# Patient Record
Sex: Female | Born: 1975 | Race: White | Hispanic: No | Marital: Single | Smoking: Current every day smoker
Health system: Southern US, Community
[De-identification: ages and names within clinical notes are randomized; demographics above are authoritative.]

## PROBLEM LIST (undated history)

## (undated) HISTORY — PX: APPENDECTOMY: SHX54

---

## 2017-10-07 ENCOUNTER — Emergency Department: Payer: Self-pay

## 2017-10-07 ENCOUNTER — Encounter: Payer: Self-pay | Admitting: Emergency Medicine

## 2017-10-07 ENCOUNTER — Other Ambulatory Visit: Payer: Self-pay

## 2017-10-07 ENCOUNTER — Emergency Department
Admission: EM | Admit: 2017-10-07 | Discharge: 2017-10-08 | Disposition: A | Discharge status: Home / Self Care | Payer: Self-pay | Attending: Emergency Medicine | Admitting: Emergency Medicine

## 2017-10-07 DIAGNOSIS — F17200 Nicotine dependence, unspecified, uncomplicated: Secondary | ICD-10-CM | POA: Insufficient documentation

## 2017-10-07 DIAGNOSIS — I639 Cerebral infarction, unspecified: Secondary | ICD-10-CM | POA: Insufficient documentation

## 2017-10-07 DIAGNOSIS — R42 Dizziness and giddiness: Secondary | ICD-10-CM

## 2017-10-07 LAB — BASIC METABOLIC PANEL
ANION GAP: 6 (ref 5–15)
BUN: 11 mg/dL (ref 6–20)
CALCIUM: 8.8 mg/dL — AB (ref 8.9–10.3)
CO2: 26 mmol/L (ref 22–32)
CREATININE: 0.49 mg/dL (ref 0.44–1.00)
Chloride: 107 mmol/L (ref 98–111)
GFR calc Af Amer: >60 mL/min (ref >60)
GLUCOSE: 115 mg/dL — AB (ref 70–99)
Potassium: 4.5 mmol/L (ref 3.5–5.1)
Sodium: 139 mmol/L (ref 135–145)

## 2017-10-07 LAB — CBC WITH DIFFERENTIAL/PLATELET
BASOS ABS: 0.1 10*3/uL (ref 0–0.1)
BASOS PCT: 1 %
EOS ABS: 0.3 10*3/uL (ref 0–0.7)
EOS PCT: 3 %
HEMATOCRIT: 40.3 % (ref 35.0–47.0)
Hemoglobin: 13.6 g/dL (ref 12.0–16.0)
Lymphocytes Relative: 28 %
Lymphs Abs: 3.4 10*3/uL (ref 1.0–3.6)
MCH: 29.2 pg (ref 26.0–34.0)
MCHC: 33.7 g/dL (ref 32.0–36.0)
MCV: 86.6 fL (ref 80.0–100.0)
MONO ABS: 0.7 10*3/uL (ref 0.2–0.9)
MONOS PCT: 6 %
NEUTROS ABS: 7.4 10*3/uL — AB (ref 1.4–6.5)
Neutrophils Relative %: 62 %
PLATELETS: 313 10*3/uL (ref 150–440)
RBC: 4.65 MIL/uL (ref 3.80–5.20)
RDW: 18.3 % — ABNORMAL HIGH (ref 11.5–14.5)
WBC: 11.9 10*3/uL — ABNORMAL HIGH (ref 3.6–11.0)

## 2017-10-07 LAB — URINALYSIS, COMPLETE (UACMP) WITH MICROSCOPIC
BILIRUBIN URINE: NEGATIVE
Bacteria, UA: NONE SEEN
GLUCOSE, UA: NEGATIVE mg/dL
KETONES UR: NEGATIVE mg/dL
Leukocytes, UA: NEGATIVE
Nitrite: NEGATIVE
PROTEIN: NEGATIVE mg/dL
Specific Gravity, Urine: 1.01 (ref 1.005–1.030)
pH: 7 (ref 5.0–8.0)

## 2017-10-07 LAB — POCT PREGNANCY, URINE: Preg Test, Ur: NEGATIVE

## 2017-10-07 MED ORDER — MECLIZINE HCL 25 MG PO TABS
50.0000 mg | ORAL_TABLET | Freq: Once | ORAL | Status: AC
  Administered 2017-10-07: 50 mg via ORAL
  Filled 2017-10-07 (×2): qty 2

## 2017-10-07 MED ORDER — ASPIRIN 81 MG PO CHEW
324.0000 mg | CHEWABLE_TABLET | Freq: Once | ORAL | Status: AC
  Administered 2017-10-07: 324 mg via ORAL
  Filled 2017-10-07: qty 4

## 2017-10-07 MED ORDER — ATORVASTATIN CALCIUM 20 MG PO TABS
ORAL_TABLET | ORAL | Status: AC
  Filled 2017-10-07: qty 1

## 2017-10-07 MED ORDER — ONDANSETRON HCL 4 MG PO TABS
ORAL_TABLET | ORAL | Status: AC
  Filled 2017-10-07: qty 1

## 2017-10-07 MED ORDER — ACETAMINOPHEN 325 MG PO TABS
ORAL_TABLET | ORAL | Status: AC
  Administered 2017-10-07: 650 mg via ORAL
  Filled 2017-10-07: qty 2

## 2017-10-07 MED ORDER — ATORVASTATIN CALCIUM 80 MG PO TABS: 80.0000 mg | ORAL_TABLET | Freq: Every day | ORAL | 0 refills | Status: AC

## 2017-10-07 MED ORDER — ONDANSETRON HCL 4 MG/2ML IJ SOLN
INTRAMUSCULAR | Status: AC
  Administered 2017-10-07: 4 mg via INTRAVENOUS
  Filled 2017-10-07: qty 2

## 2017-10-07 MED ORDER — ONDANSETRON HCL 4 MG/2ML IJ SOLN: 4.0000 mg | Freq: Once | INTRAMUSCULAR | Status: DC

## 2017-10-07 MED ORDER — METHYLPREDNISOLONE SODIUM SUCC 125 MG IJ SOLR
125.0000 mg | Freq: Once | INTRAMUSCULAR | Status: AC
  Administered 2017-10-07: 125 mg via INTRAVENOUS
  Filled 2017-10-07: qty 2

## 2017-10-07 MED ORDER — ACETAMINOPHEN 325 MG PO TABS
650.0000 mg | ORAL_TABLET | Freq: Once | ORAL | Status: AC
  Administered 2017-10-07: 650 mg via ORAL

## 2017-10-07 MED ORDER — NICOTINE 21 MG/24HR TD PT24
21.0000 mg | MEDICATED_PATCH | Freq: Once | TRANSDERMAL | Status: DC
  Administered 2017-10-07: 21 mg via TRANSDERMAL
  Filled 2017-10-07: qty 1

## 2017-10-07 MED ORDER — MECLIZINE HCL 25 MG PO TABS: 25.0000 mg | ORAL_TABLET | Freq: Three times a day (TID) | ORAL | 1 refills | Status: DC | PRN

## 2017-10-07 MED ORDER — ASPIRIN EC 81 MG PO TBEC: 81.0000 mg | DELAYED_RELEASE_TABLET | Freq: Every day | ORAL | 0 refills | Status: AC

## 2017-10-07 MED ORDER — MORPHINE SULFATE (PF) 4 MG/ML IV SOLN: 4.0000 mg | Freq: Once | INTRAVENOUS | Status: DC

## 2017-10-07 MED ORDER — MORPHINE SULFATE (PF) 4 MG/ML IV SOLN
INTRAVENOUS | Status: AC
  Filled 2017-10-07: qty 1

## 2017-10-07 MED ORDER — ASPIRIN EC 81 MG PO TBEC: 81.0000 mg | DELAYED_RELEASE_TABLET | Freq: Every day | ORAL | 0 refills | Status: DC

## 2017-10-07 MED ORDER — MECLIZINE HCL 25 MG PO TABS: 25.0000 mg | ORAL_TABLET | Freq: Three times a day (TID) | ORAL | 1 refills | Status: AC | PRN

## 2017-10-07 MED ORDER — ASPIRIN EC 325 MG PO TBEC: 325.0000 mg | DELAYED_RELEASE_TABLET | Freq: Every day | ORAL | 0 refills | Status: DC

## 2017-10-07 MED ORDER — ONDANSETRON HCL 4 MG/2ML IJ SOLN
4.0000 mg | Freq: Once | INTRAMUSCULAR | Status: AC
  Administered 2017-10-07: 4 mg via INTRAVENOUS

## 2017-10-07 MED ORDER — LORAZEPAM 2 MG/ML IJ SOLN
0.5000 mg | Freq: Once | INTRAMUSCULAR | Status: AC
  Administered 2017-10-07: 0.5 mg via INTRAVENOUS
  Filled 2017-10-07: qty 1

## 2017-10-07 MED ORDER — ONDANSETRON HCL 4 MG/2ML IJ SOLN
INTRAMUSCULAR | Status: AC
  Filled 2017-10-07: qty 2

## 2017-10-07 MED ORDER — SODIUM CHLORIDE 0.9 % IV BOLUS
1000.0000 mL | Freq: Once | INTRAVENOUS | Status: AC
  Administered 2017-10-07: 1000 mL via INTRAVENOUS

## 2017-10-07 MED ORDER — ATORVASTATIN CALCIUM 80 MG PO TABS: 80.0000 mg | ORAL_TABLET | Freq: Every day | ORAL | 0 refills | Status: DC

## 2017-10-07 MED ORDER — ATORVASTATIN CALCIUM 20 MG PO TABS
80.0000 mg | ORAL_TABLET | Freq: Every day | ORAL | Status: DC
  Administered 2017-10-07: 80 mg via ORAL

## 2017-10-07 NOTE — ED Triage Notes (Signed)
Pt via ems from home with dizziness, headache, earache since awakening at 1040. Pt went to sleep at 0400 this morning and was fine. She denies any health problems or recent illness. Pt tearful during triage.

## 2017-10-07 NOTE — ED Notes (Signed)
Patient transported to MRI 

## 2017-10-07 NOTE — ED Notes (Signed)
Pt. Returned to tx. room in stable condition with no acute changes since departure from unit for scans.   

## 2017-10-07 NOTE — ED Notes (Signed)
Patient transported to MRI by this EDT.  

## 2017-10-07 NOTE — ED Notes (Signed)
Pt given ginger ale, peanut butter and crackers.

## 2017-10-07 NOTE — ED Notes (Signed)

## 2017-10-07 NOTE — Discharge Instructions (Addendum)
Take the aspirin and Lipitor as prescribed until you follow-up with a primary care doctor and a neurologist.  You can take the meclizine as needed for dizziness or vertigo.  Return to the emergency department immediately for new, worsening, persistent severe dizziness or vertigo, vision changes, speech difficulty, weakness or numbness, difficulty walking, or any other new or worsening symptoms that concern you.  You may also return at any time if you wish to resume your stroke work-up as an inpatient.

## 2017-10-07 NOTE — ED Notes (Signed)
Pt reports that she still has some dizziness ("which might be hunger") and that she feels much better. Pt alert & oriented. NAD noted.

## 2017-10-07 NOTE — ED Notes (Addendum)
Call from MRI that pt c/o sudden onset excruciating posterior HA. Verbal order for morphine and zofran, given to pt in MRI, but pt refused. States would take tylenol when returned to ER tx room

## 2017-10-07 NOTE — ED Provider Notes (Signed)
[facility] Emergency Department Provider Note  ____________________________________________  Time seen: Approximately 6:35 PM  I have reviewed the triage vital signs and the nursing notes.   HISTORY  Chief Complaint Dizziness    HPI Catherine Ford is a 42 y.o. female with no known significant past medical history who complains of sudden onset of dizziness described as room spinning since awakening at 10:40 AM today when she woke up and got out of bed.  At 4 AM when she went to sleep early this morning she was fine.   Denies any recent illness or head trauma.  Never had anything like this before.  Denies history of vertigo.  No vision changes or hearing changes.  No peripheral paresthesias or weakness.  Feels off balance with walking due to the spinning motion.  Symptoms have been constant and severe, no aggravating or alleviating factors.  Denies headache or neck pain    History reviewed. No pertinent past medical history.   There are no active problems to display for this patient.    Past Surgical History:  Procedure Laterality Date  . APPENDECTOMY       Prior to Admission medications   Medication Sig Start Date End Date Taking? Authorizing Provider  aspirin EC 325 MG tablet Take 1 tablet (325 mg total) by mouth daily. 10/07/17   Sharman CheekStafford, Shamyah Stantz, MD  meclizine (ANTIVERT) 25 MG tablet Take 1 tablet (25 mg total) by mouth 3 (three) times daily as needed for dizziness or nausea. 10/07/17   Sharman CheekStafford, Michalene Debruler, MD     Allergies Iodine and Other   History reviewed. No pertinent family history.  Social History Social History   Tobacco Use  . Smoking status: Current Every Day Smoker    Packs/day: 1.00  . Smokeless tobacco: Never Used  Substance Use Topics  . Alcohol use: Not Currently  . Drug use: Never    Review of Systems  Constitutional:   No fever or chills.  ENT:   No sore throat. No rhinorrhea. Cardiovascular:   No chest pain or  syncope. Respiratory:   No dyspnea or cough. Gastrointestinal:   Negative for abdominal pain, vomiting and diarrhea.  Musculoskeletal:   Negative for focal pain or swelling All other systems reviewed and are negative except as documented above in ROS and HPI.  ____________________________________________   PHYSICAL EXAM:  VITAL SIGNS: ED Triage Vitals  Enc Vitals Group     BP 10/07/17 1126 (!) 156/102     Pulse Rate 10/07/17 1126 61     Resp 10/07/17 1126 17     Temp 10/07/17 1126 97.9 F (36.6 C)     Temp Source 10/07/17 1126 Oral     SpO2 10/07/17 1126 98 %     Weight 10/07/17 1115 245 lb (111.1 kg)     Height 10/07/17 1115 5\' 8"  (1.727 m)     Head Circumference --      Peak Flow --      Pain Score 10/07/17 1115 4     Pain Loc --      Pain Edu? --      Excl. in GC? --     Vital signs reviewed, nursing assessments reviewed.   Constitutional:   Alert and oriented. Non-toxic appearance. Eyes:   Conjunctivae are normal. EOMI. PERRL.  Dizziness worsened by eye movements.  No nystagmus. ENT      Head:   Normocephalic and atraumatic.      Nose:   No congestion/rhinnorhea.  Mouth/Throat:   MMM, no pharyngeal erythema. No peritonsillar mass.       Neck:   No meningismus. Full ROM. Hematological/Lymphatic/Immunilogical:   No cervical lymphadenopathy. Cardiovascular:   RRR. Symmetric bilateral radial and DP pulses.  No murmurs. Cap refill less than 2 seconds. Respiratory:   Normal respiratory effort without tachypnea/retractions. Breath sounds are clear and equal bilaterally. No wheezes/rales/rhonchi. Gastrointestinal:   Soft and nontender. Non distended. There is no CVA tenderness.  No rebound, rigidity, or guarding. Musculoskeletal:   Normal range of motion in all extremities. No joint effusions.  No lower extremity tenderness.  No edema. Neurologic:   Normal speech and language.  Motor grossly intact. Normal finger-to-nose Cranial nerves III through XII intact No  acute focal neurologic deficits are appreciated.  Skin:    Skin is warm, dry and intact. No rash noted.  No petechiae, purpura, or bullae.  ____________________________________________    LABS (pertinent positives/negatives) (all labs ordered are listed, but only abnormal results are displayed) Labs Reviewed  URINALYSIS, COMPLETE (UACMP) WITH MICROSCOPIC - Abnormal; Notable for the following components:      Result Value   Color, Urine YELLOW (*)    APPearance CLEAR (*)    Hgb urine dipstick MODERATE (*)    All other components within normal limits  BASIC METABOLIC PANEL - Abnormal; Notable for the following components:   Glucose, Bld 115 (*)    Calcium 8.8 (*)    All other components within normal limits  CBC WITH DIFFERENTIAL/PLATELET - Abnormal; Notable for the following components:   WBC 11.9 (*)    RDW 18.3 (*)    Neutro Abs 7.4 (*)    All other components within normal limits  POC URINE PREG, ED  POCT PREGNANCY, URINE   ____________________________________________   EKG  Interpreted by me Normal sinus rhythm rate of 63, normal axis intervals QRS ST segments and T waves.    ____________________________________________    RADIOLOGY  Ct Head Wo Contrast  Result Date: 10/07/2017 CLINICAL DATA:  Headache and dizziness. EXAM: CT HEAD WITHOUT CONTRAST TECHNIQUE: Contiguous axial images were obtained from the base of the skull through the vertex without intravenous contrast. COMPARISON:  None. FINDINGS: Brain: Age-indeterminate lacunar infarct in the right cerebellum. No hemorrhage, hydrocephalus, extra-axial collection or mass lesion/mass effect. Vascular: No hyperdense vessel or unexpected calcification. Skull: Normal. Negative for fracture or focal lesion. Sinuses/Orbits: No acute finding. Other: None. IMPRESSION: 1. 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 Electronically Signed   By: Obie Dredge M.D.   On: 10/07/2017 15:02     ____________________________________________   PROCEDURES Procedures  ____________________________________________  DIFFERENTIAL DIAGNOSIS   Peripheral vertigo, less likely ischemic stroke or intracranial hemorrhage.  Doubt intracranial hypertension glaucoma.  CLINICAL IMPRESSION / ASSESSMENT AND PLAN / ED COURSE  Pertinent labs & imaging results that were available during my care of the patient were reviewed by me and considered in my medical decision making (see chart for details).      Clinical Course as of Oct 07 1833  Mon Oct 07, 2017  1318 Patient presents with sudden onset of dizziness, consistent with peripheral vertigo.  Evidence of otitis media of the right ear on exam.  Doubt stroke or intracranial hemorrhage meningitis encephalitis.  Patient given steroids, Ativan, meclizine and Zofran as well as IV fluids for hydration.  On reassessment at 1:15 PM, patient is not feeling significantly better and still has persistent vertigo.  At this point given his symptoms have now been continuous for  2-1/2 hours, I will obtain imaging to evaluate for intracranial aneurysm   [PS]  1709 Offered hospitalization for stroke workup after talking with Dr. Thad Ranger.  Pt declines, wants to go home tonight if at all possible. I will obtain mri/mra from ED. Give aspirin. If mri reassuring will plan for outpatient follow up.    [PS]    Clinical Course User Index [PS] Sharman Cheek, MD     ----------------------------------------- 6:36 PM on 10/07/2017 -----------------------------------------  Awaiting MRI/MRA.  Patient still feeling better.  Care will be signed out to Dr. Marisa Severin to follow-up on MRI.  If results are all reassuring, patient can be discharged outpatient follow-up.  If is positive for acute stroke, would recommend patient hospitalization for further stroke work-up especially given lack of establish follow-up.  ____________________________________________   FINAL  CLINICAL IMPRESSION(S) / ED DIAGNOSES    Final diagnoses:  Dizziness  Vertigo  Cerebellar infarct [facility])     ED Discharge Orders        Ordered    aspirin EC 325 MG tablet  Daily     10/07/17 1834    meclizine (ANTIVERT) 25 MG tablet  3 times daily PRN     10/07/17 1834      Portions of this note were generated with dragon dictation software. Dictation errors may occur despite best attempts at proofreading.    Sharman Cheek, MD 10/07/17 [phone]

## 2017-10-07 NOTE — ED Provider Notes (Signed)
-----------------------------------------   11:25 PM on 10/07/2017 -----------------------------------------  I received signout on this patient from Dr. Scotty CourtStafford.  The patient had posterior headache and vertigo type symptoms and CT showing possible age-indeterminate lacunar infarct.  The plan of care at the time of signout was to obtain an MRI to further evaluate the infarct and determine whether it was acute.  The patient had expressed a desire to be discharged home even if the imaging showed acute stroke.  On reassessment, the patient's symptoms have not worsened, and she feels relatively comfortable.  MRI confirms that the findings seen on CT represent an acute infarct.  I had an extensive discussion with the patient about the results of the work-up, the fact that the imaging showed a likely acute stroke, and the increased risk for further stroke if the patient did not have appropriate work-up and risk stratification.  I strongly recommended that the patient be admitted.  The patient states that she does not want to be admitted at this time because she does not want to be in the hospital.  At this time, the patient is able to paraphrase these risks back to me.  She demonstrates appropriate decision-making capacity.  I gave her extensive return precautions.  We have provided her with neurology and primary care referrals.  I also initiated the patient on aspirin and Lipitor.  I answered all of her questions and I reminded her that she may return at any time if she changes her mind and wishes to resume her work-up in the hospital.   Dionne BucySiadecki, Andreal Vultaggio, MD 10/07/17 [phone]

## 2019-05-18 IMAGING — MR MR MRA NECK W/O CM
9 of 12 series · 26 of 48 positions shown · non-contrast
Comparison: Prior CT from earlier the same day.

CLINICAL DATA: Initial evaluation for acute onset dizziness.

EXAM:
MRI HEAD WITHOUT CONTRAST
MRA HEAD WITHOUT CONTRAST
MRA NECK WITHOUT CONTRAST
TECHNIQUE: Multiplanar, multiecho pulse sequences of the brain and surrounding
structures were obtained without intravenous contrast. Angiographic
images of the Circle of Willis were obtained using MRA technique
without intravenous contrast. Angiographic images of the neck were
obtained using MRA technique without and with intravenous contrast.
Carotid stenosis measurements (when applicable) are obtained
utilizing NASCET criteria, using the distal internal carotid
diameter as the denominator.

[Series 2: T1 · sagittal · 5.0mm · 0.45mm/px · 1 of 23 slices shown]
[im 1/23]
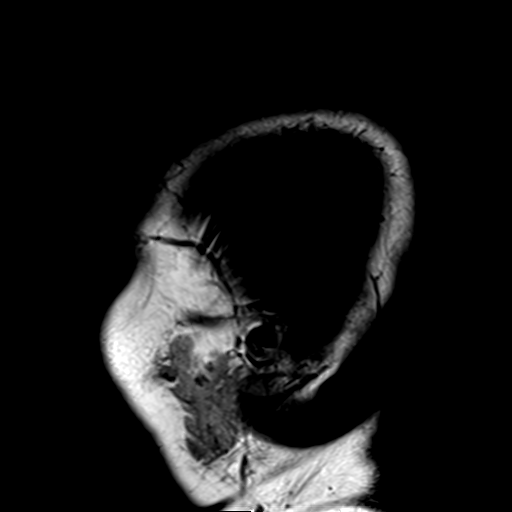

[Series 4: DWI · axial · 3.0mm · 1.80mm/px · z∈[-83,+79]mm · 3 of 52 slices shown (1 of 2)]
[im 1/52]
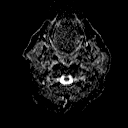
[im 26/52]
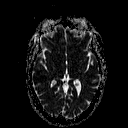
[im 52/52]
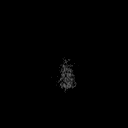

[Series 7: DWI · coronal · 3.0mm · 1.80mm/px · 3 of 47 slices shown (2 of 2)]
[im 1/47]
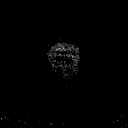
[im 24/47]
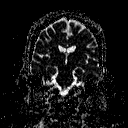
[im 47/47]
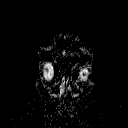

[Series 12: T2 · axial · 5.0mm · 0.45mm/px · z∈[-83,+86]mm · 2 of 27 slices shown (1 of 3)]
[im 1/27]
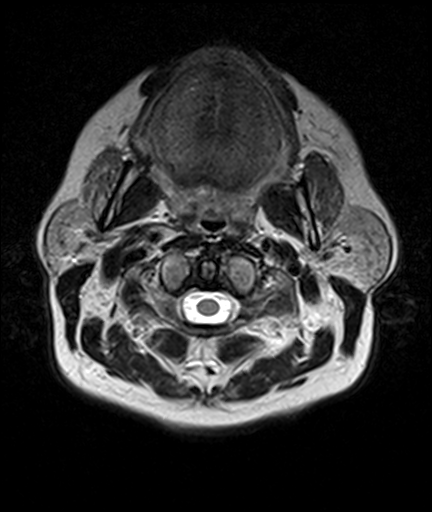
[im 27/27]
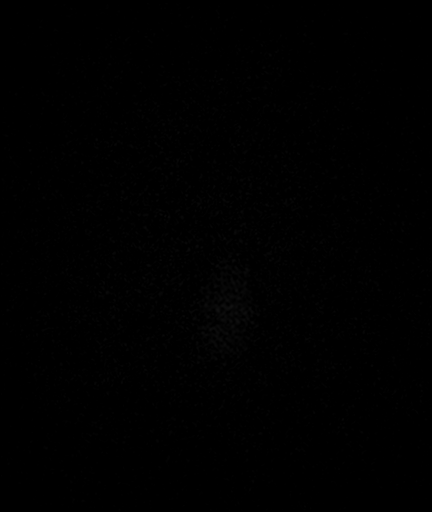

[Series 13: TOF · axial · non-contrast · 0.7mm · 0.37mm/px · z∈[-65,+12]mm · 7 of 130 slices shown]
[im 1/130]
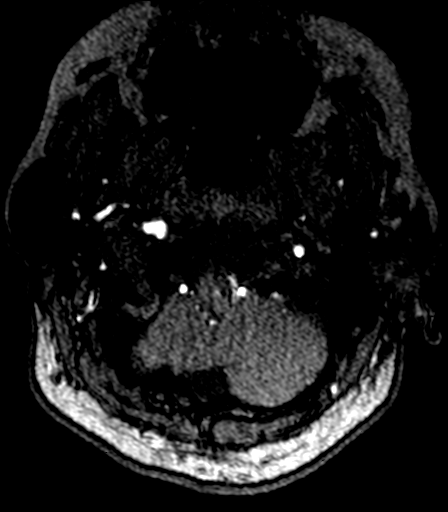
[im 19/130]
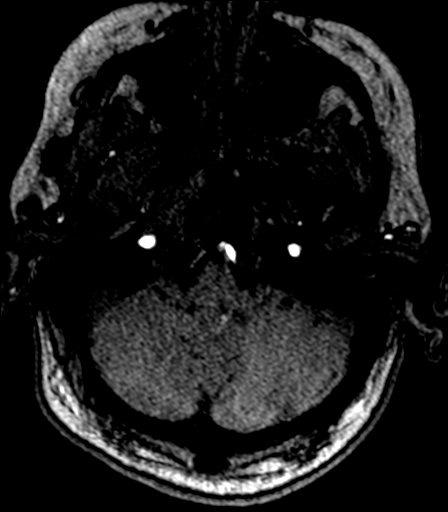
[im 37/130]
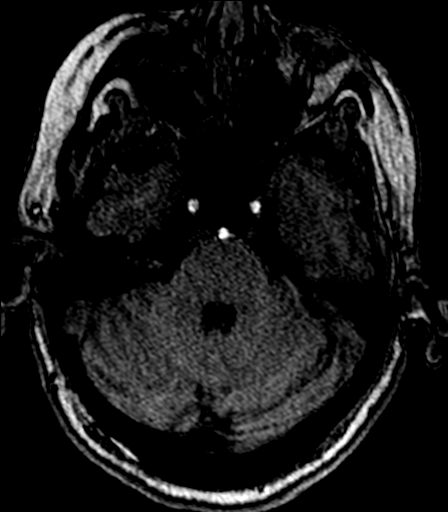
[im 56/130]
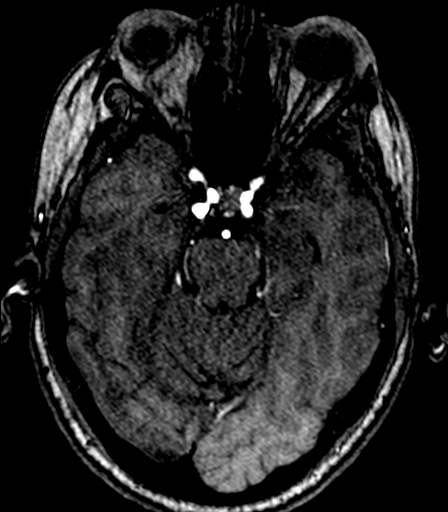
[im 74/130]
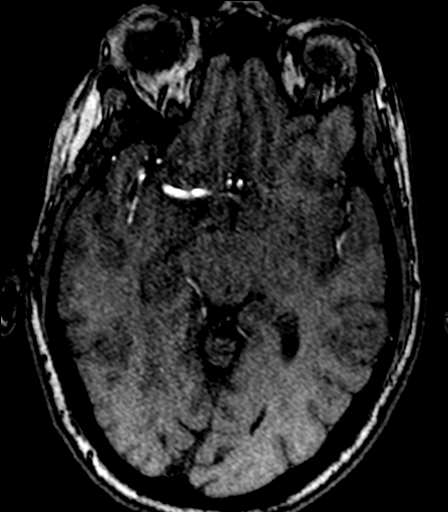
[im 93/130]
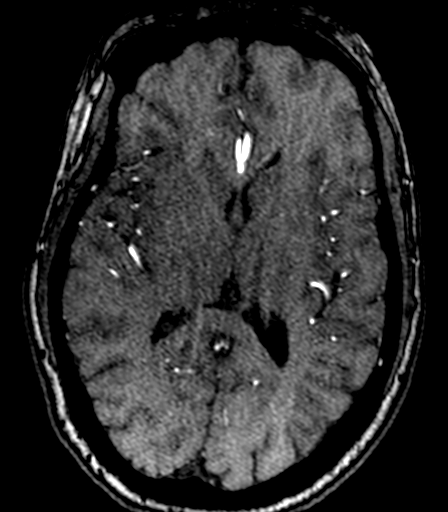
[im 111/130]
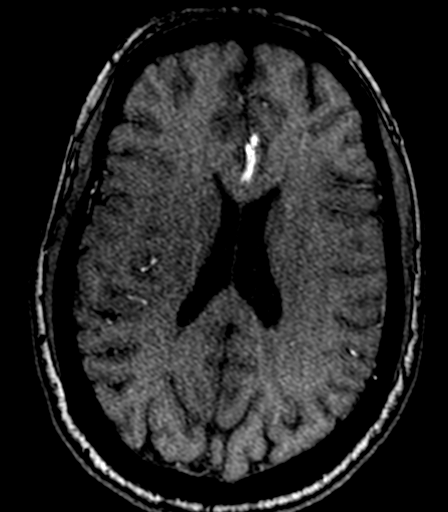

[Series 18: FLAIR · axial · 3.0mm · 0.45mm/px · z∈[-79,+83]mm · 4 of 55 slices shown]
[im 1/55]
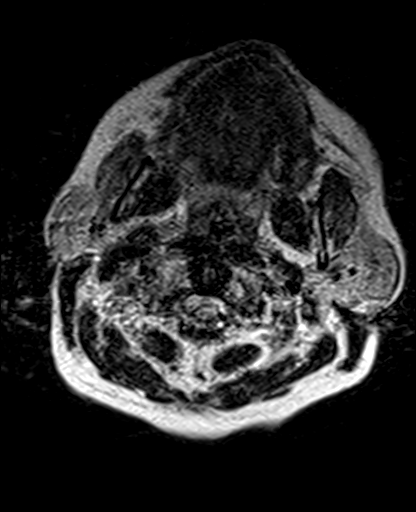
[im 19/55]
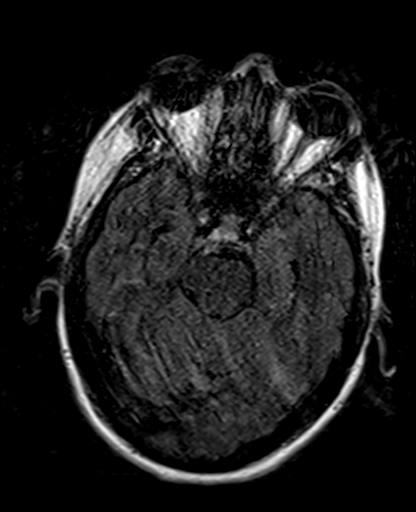
[im 37/55]
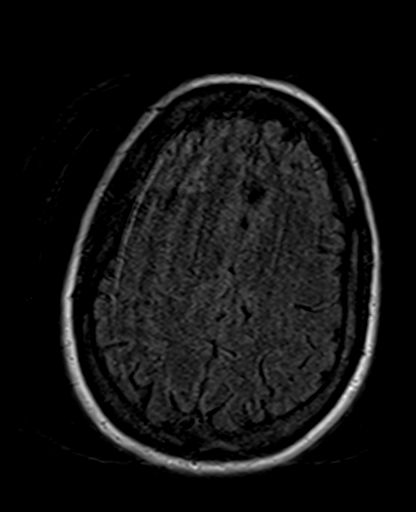
[im 55/55]
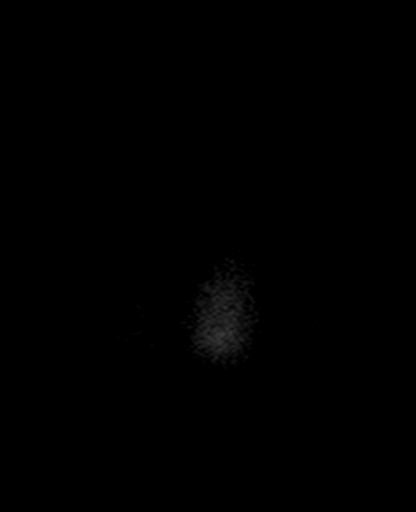

[Series 19: GRE · axial · 5.0mm · 0.45mm/px · z∈[-76,+80]mm · 2 of 25 slices shown]
[im 1/25]
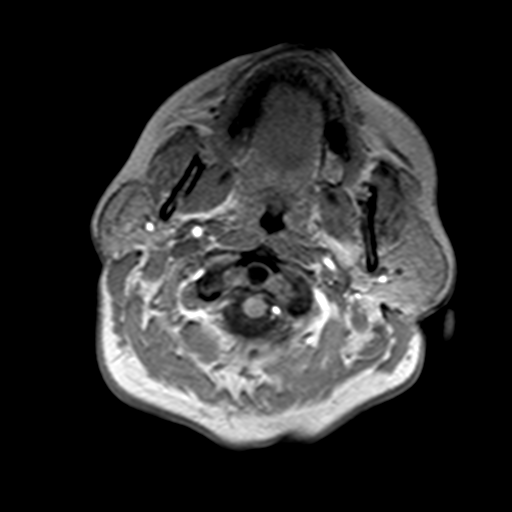
[im 25/25]
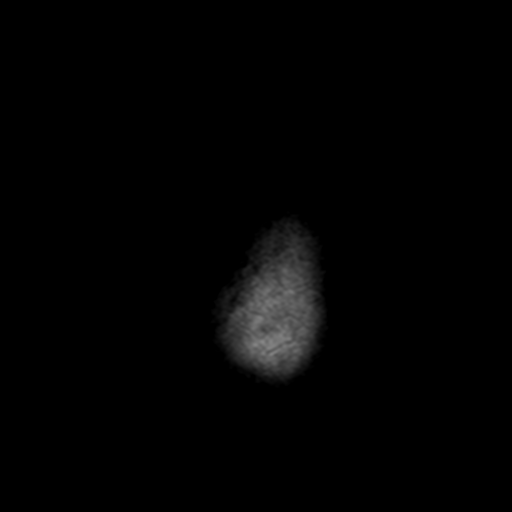

[Series 20: T2 · axial · 5.0mm · 1.20mm/px · z∈[-83,+86]mm · 2 of 27 slices shown (2 of 3)]
[im 1/27]
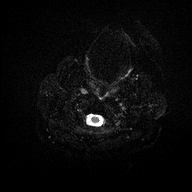
[im 27/27]
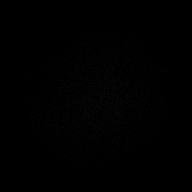

[Series 21: T2 · coronal · 5.0mm · 0.43mm/px · 2 of 29 slices shown (3 of 3)]
[im 1/29]
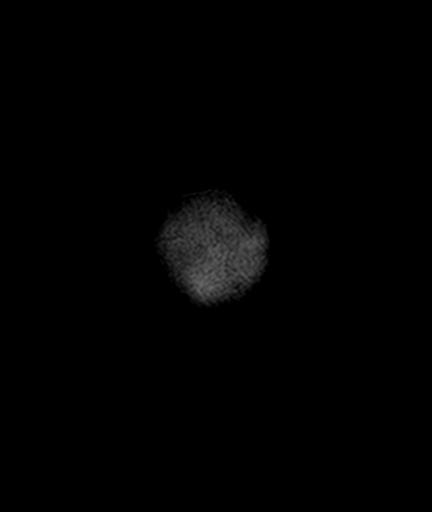
[im 29/29]
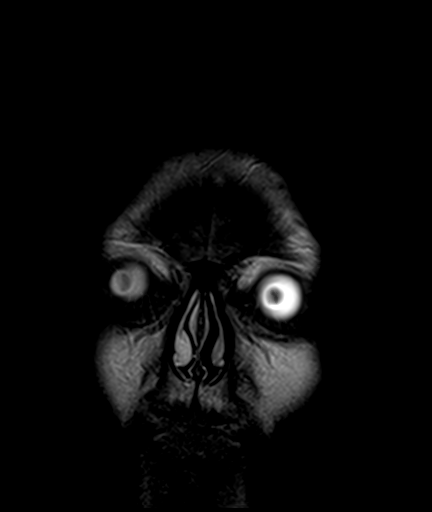

[26 of 48 positions shown; findings below may reference images not displayed]

FINDINGS: MRI HEAD FINDINGS

Brain: Examination technically limited by motion artifact.

Cerebral volume within normal limits for patient age. Two small foci
of restricted diffusion seen within the right cerebellar hemisphere,
consistent with acute ischemic infarcts. Largest area of infarction
present inferiorly and measures approximately 2 cm in size. Second
infarct involves the superior right cerebellar hemisphere. No
associated hemorrhage or mass effect. These correspond with signal
abnormality seen on prior CT. No other evidence for acute or
subacute ischemia. Gray-white matter differentiation otherwise
maintained. No other areas of chronic infarction. No foci of
susceptibility artifact to suggest acute or chronic intracranial
hemorrhage.

No mass lesion, midline shift or mass effect. No hydrocephalus. No
extra-axial fluid collection. Major dural sinuses are grossly
patent.

Pituitary gland and suprasellar region are normal. Midline
structures intact and normal.

Vascular: Major intracranial vascular flow voids well maintained and
normal in appearance.

Skull and upper cervical spine: Craniocervical junction normal.
Visualized upper cervical spine within normal limits. Bone marrow
signal intensity normal. No scalp soft tissue abnormality.

Sinuses/Orbits: Globes and orbital soft tissues within normal
limits. Paranasal sinuses are clear. No mastoid effusion. Inner ear
structures normal.

Other: None.

MRA HEAD FINDINGS

ANTERIOR CIRCULATION:

Distal cervical segments of the internal carotid arteries are widely
patent with antegrade flow. Petrous, cavernous, and supraclinoid
segments widely patent without stenosis. A1 segments widely patent
bilaterally. Left A1 hypoplastic, accounting for the slightly
diminutive left ICA is compared to the right. Normal anterior
communicating artery. Anterior cerebral arteries widely patent to
their distal aspects without stenosis. M1 segments widely patent
without stenosis. No proximal M2 occlusion. Distal MCA branches well
perfused and symmetric.

POSTERIOR CIRCULATION:

In vertebral arteries patent to the vertebrobasilar junction without
stenosis. Left vertebral artery dominant. Partially visualized
posterior inferior cerebral arteries patent bilaterally. Basilar
patent to its distal aspect without stenosis. Superior cerebral
arteries patent bilaterally. Left PCA supplied via the basilar.
Right PCA supplied via the basilar as well as the prominent right
posterior communicating artery. Tiny left posterior communicating
artery noted as well. PCAs widely patent to their distal aspects
without stenosis.

No intracranial aneurysm.

MRA NECK FINDINGS

Examination technically limited by motion artifact and lack of IV
contrast.

Time-of-flight imaging demonstrates patent antegrade flow within the
carotid and vertebral arteries bilaterally.

Visualized aortic arch of normal caliber with normal 3 vessel
morphology. No flow-limiting stenosis about the origin of the great
vessels. Visualized subclavian arteries patent without stenosis.

Right common and internal carotid arteries patent without stenosis
or occlusion. No significant atheromatous narrowing about the right
carotid bifurcation.

Left common and internal carotid arteries patent without stenosis or
occlusion. No significant atheromatous narrowing about the left
carotid bifurcation.

Both of the vertebral arteries arise from the subclavian arteries.
Left vertebral artery dominant. Pre foraminal right V1 segment not
well assessed on this examination. Vertebral arteries patent within
the neck without occlusion or obvious stenosis.
IMPRESSION: MRI HEAD IMPRESSION:

1. Two small acute ischemic nonhemorrhagic right cerebellar
infarcts. No associated mass effect. These correspond with signal
abnormality seen on prior CT.
2. Otherwise normal brain MRI.

MRA HEAD IMPRESSION:

Normal intracranial MRA. No large vessel occlusion. No
hemodynamically significant or correctable stenosis.

MRA NECK IMPRESSION:

Negative MRA of the neck with no vascular occlusion or
hemodynamically significant or critical stenosis. Please note
examination is somewhat technically limited by motion artifact and
lack of IV contrast.

## 2019-05-18 IMAGING — CT CT HEAD W/O CM
3 series · 16 of 47 positions shown, 19 images · non-contrast
Comparison: None.

CLINICAL DATA: Headache and dizziness.

EXAM:
CT HEAD WITHOUT CONTRAST
TECHNIQUE: Contiguous axial images were obtained from the base of the skull
through the vertex without intravenous contrast.

[Series 2: head wo · axial · 0.41mm/px · z∈[+236,+361]mm · 10 of 30 slices shown, 13 images]
[im 3/30  brain]
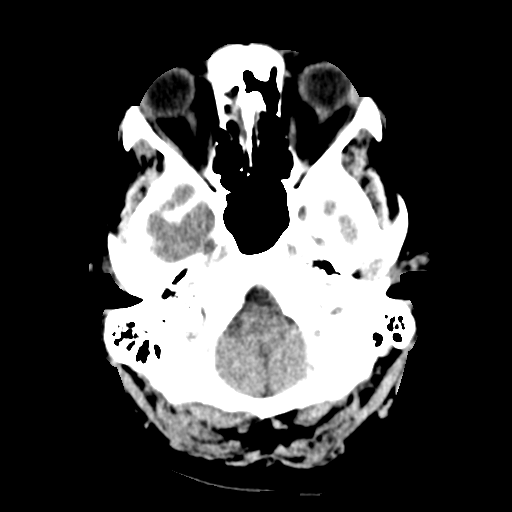
[im 3/30  bone]
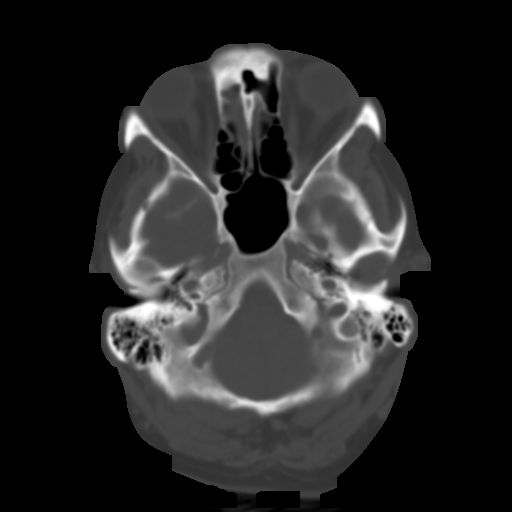
[im 6/30  brain]
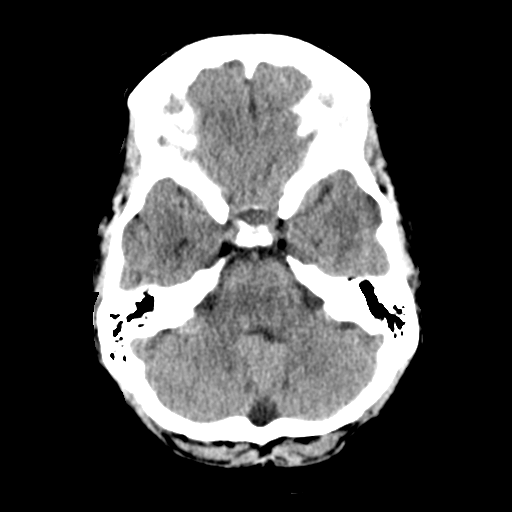
[im 9/30  brain]
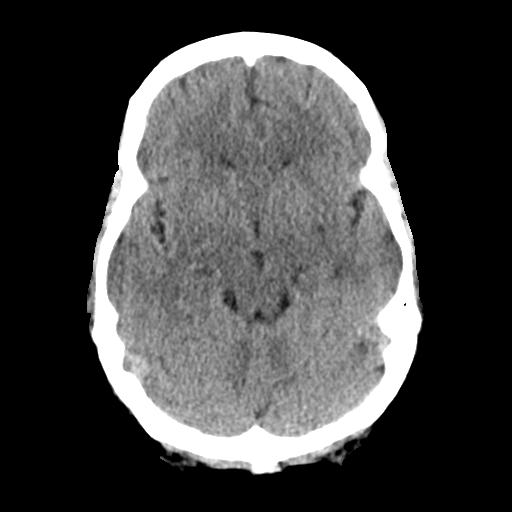
[im 11/30  brain]
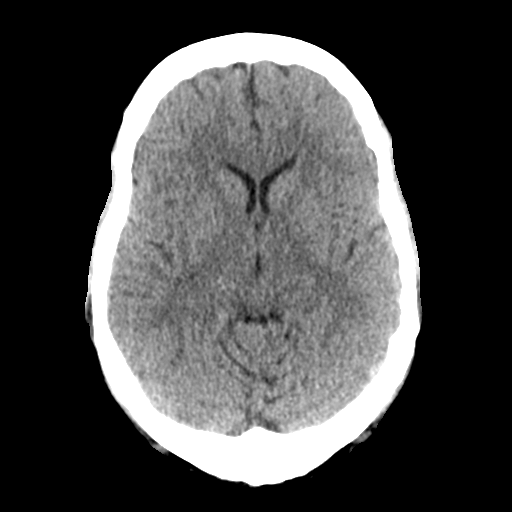
[im 14/30  brain]
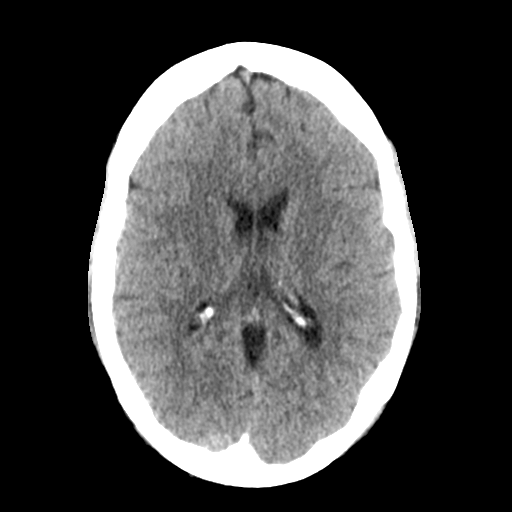
[im 14/30  bone]
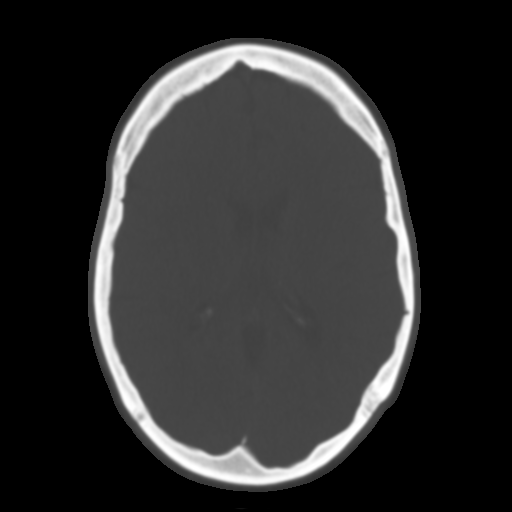
[im 17/30  brain]
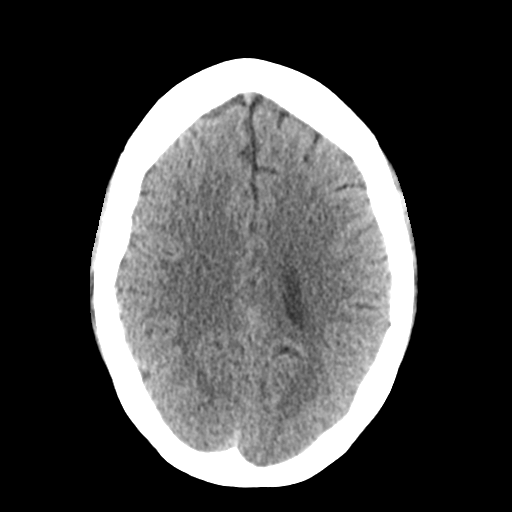
[im 20/30  brain]
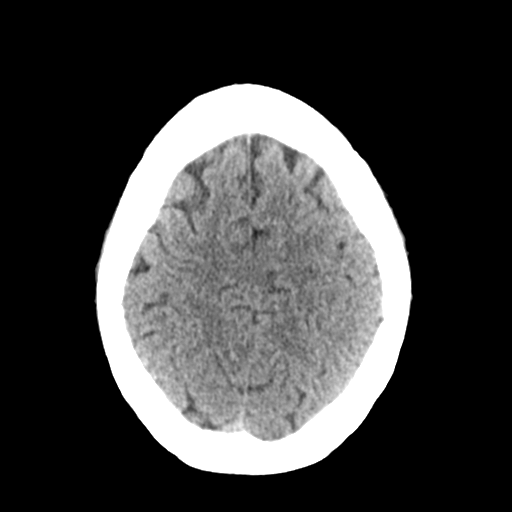
[im 23/30  brain]
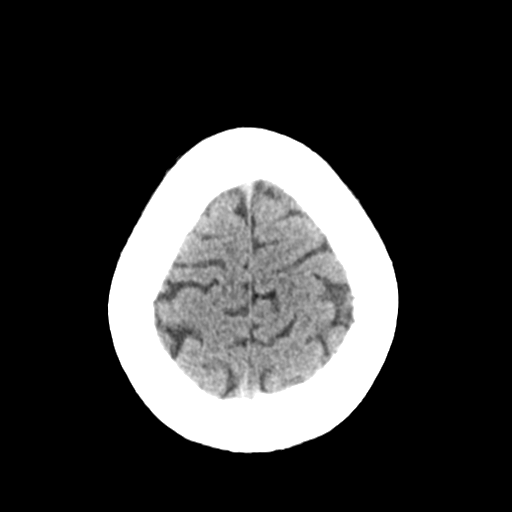
[im 25/30  brain]
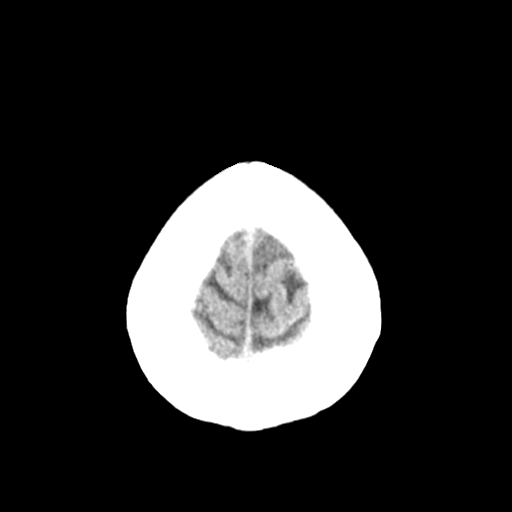
[im 25/30  bone]
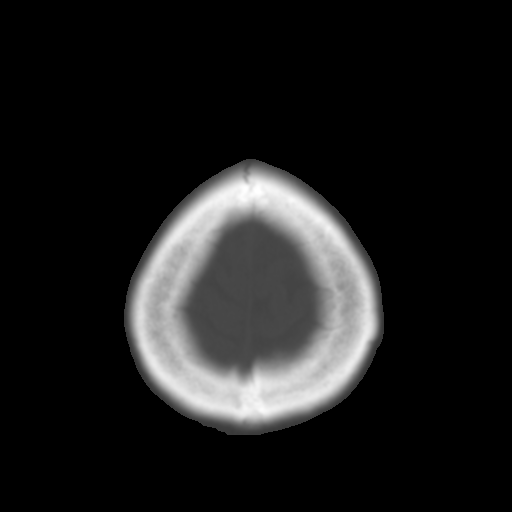
[im 28/30  brain]
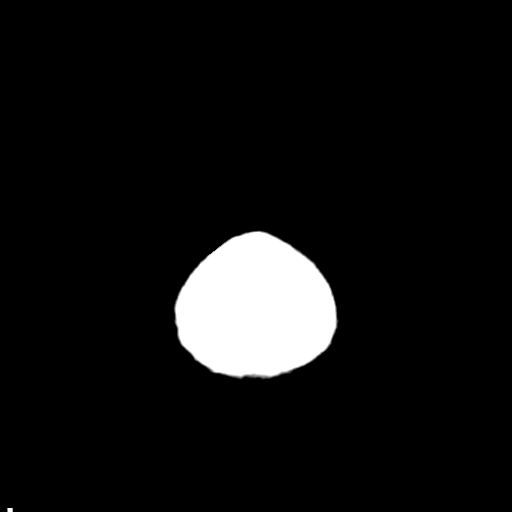

[Series 4: coronal soft tissue · coronal · 0.31mm/px · 3 of 63 slices shown]
[im 21/63  brain]
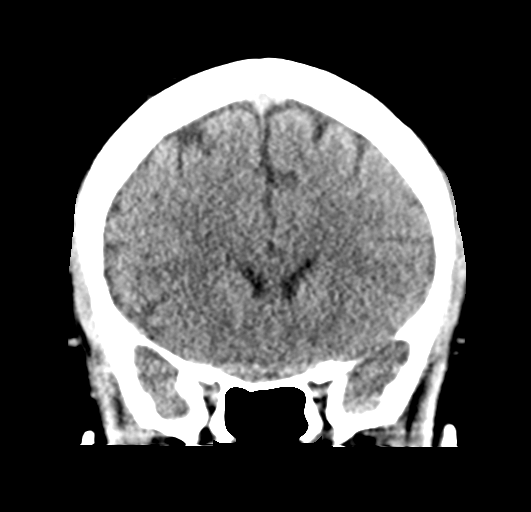
[im 28/63  brain]
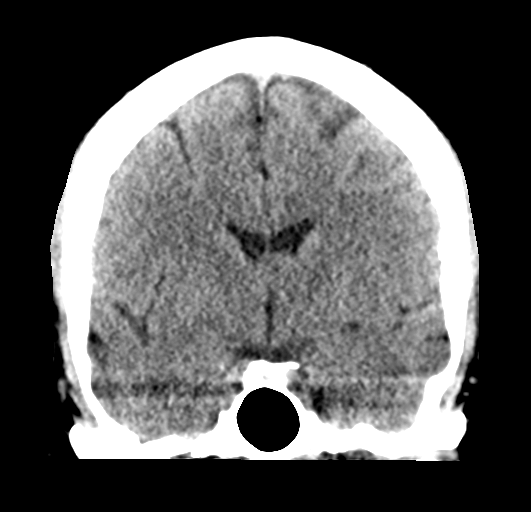
[im 35/63  brain]
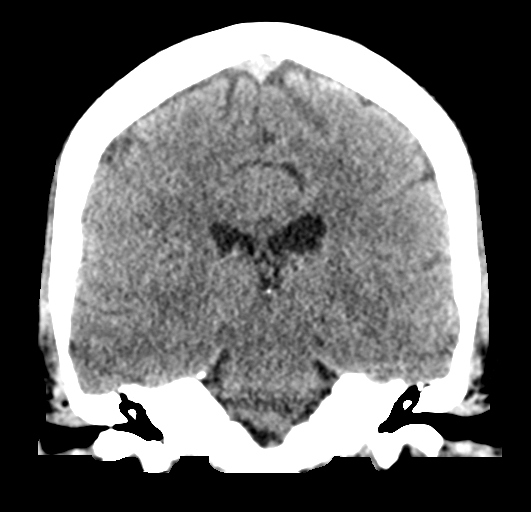

[Series 5: sagittal soft tissue · sagittal · 0.31mm/px · 3 of 48 slices shown]
[im 16/48  brain]
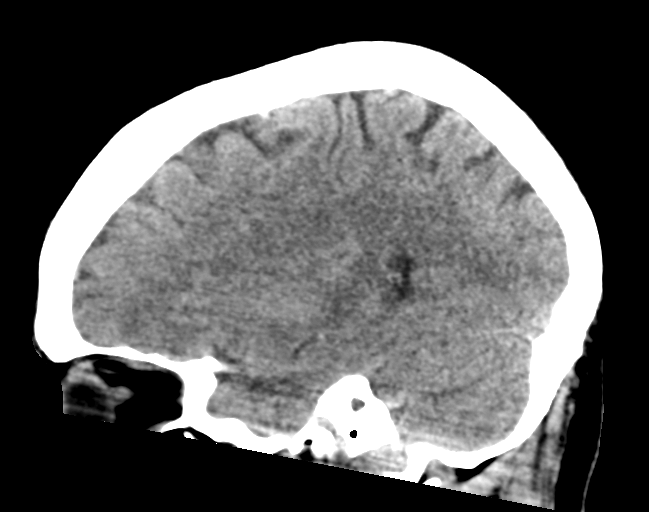
[im 24/48  brain]
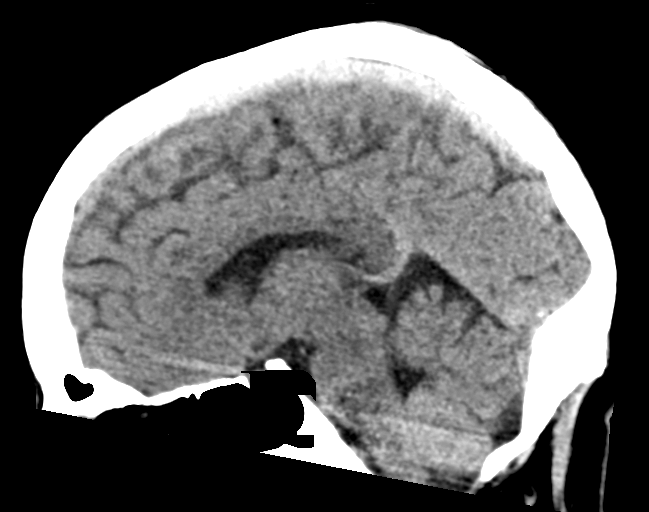
[im 32/48  brain]
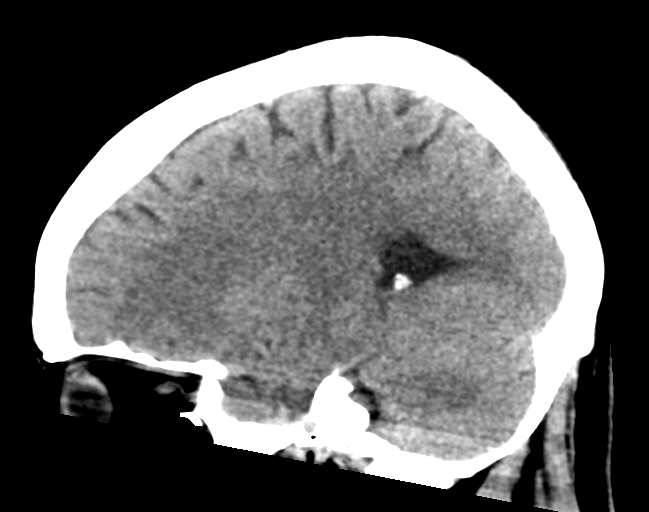

[16 of 47 positions shown; findings below may reference images not displayed]

FINDINGS: Brain: Age-indeterminate lacunar infarct in the right cerebellum. No
hemorrhage, hydrocephalus, extra-axial collection or mass
lesion/mass effect.

Vascular: No hyperdense vessel or unexpected calcification.

Skull: Normal. Negative for fracture or focal lesion.

Sinuses/Orbits: No acute finding.

Other: None.
IMPRESSION: 1. Age-indeterminate lacunar infarct in the right cerebellum.

## 2022-09-24 ENCOUNTER — Other Ambulatory Visit: Payer: Self-pay | Admitting: Family Medicine

## 2022-09-24 DIAGNOSIS — R140 Abdominal distension (gaseous): Secondary | ICD-10-CM

## 2022-10-08 ENCOUNTER — Ambulatory Visit
Admission: RE | Admit: 2022-10-08 | Discharge: 2022-10-08 | Disposition: A | Discharge status: Home / Self Care | Payer: BC Managed Care – PPO | Source: Ambulatory Visit | Attending: Family Medicine | Admitting: Family Medicine

## 2022-10-08 DIAGNOSIS — R140 Abdominal distension (gaseous): Secondary | ICD-10-CM

## 2023-02-25 ENCOUNTER — Other Ambulatory Visit: Payer: Self-pay | Admitting: Family Medicine

## 2023-02-25 DIAGNOSIS — Z1231 Encounter for screening mammogram for malignant neoplasm of breast: Secondary | ICD-10-CM

## 2024-04-10 ENCOUNTER — Other Ambulatory Visit: Payer: Self-pay | Admitting: Family Medicine

## 2024-04-10 DIAGNOSIS — N63 Unspecified lump in unspecified breast: Secondary | ICD-10-CM
